# Patient Record
Sex: Male | Born: 1995
Health system: Southern US, Community
[De-identification: ages and names within clinical notes are randomized; demographics above are authoritative.]

## PROBLEM LIST (undated history)

## (undated) DIAGNOSIS — T7840XA Allergy, unspecified, initial encounter: Secondary | ICD-10-CM

## (undated) DIAGNOSIS — F419 Anxiety disorder, unspecified: Secondary | ICD-10-CM

## (undated) DIAGNOSIS — B019 Varicella without complication: Secondary | ICD-10-CM

## (undated) HISTORY — DX: Varicella without complication: B01.9

## (undated) HISTORY — DX: Allergy, unspecified, initial encounter: T78.40XA

## (undated) HISTORY — DX: Anxiety disorder, unspecified: F41.9

---
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
## 2015-11-13 DIAGNOSIS — J208 Acute bronchitis due to other specified organisms: Secondary | ICD-10-CM | POA: Diagnosis not present

## 2015-11-13 DIAGNOSIS — B9689 Other specified bacterial agents as the cause of diseases classified elsewhere: Secondary | ICD-10-CM | POA: Diagnosis not present

## 2015-12-05 DIAGNOSIS — R0982 Postnasal drip: Secondary | ICD-10-CM | POA: Diagnosis not present

## 2015-12-05 DIAGNOSIS — B9789 Other viral agents as the cause of diseases classified elsewhere: Secondary | ICD-10-CM | POA: Diagnosis not present

## 2015-12-05 DIAGNOSIS — J069 Acute upper respiratory infection, unspecified: Secondary | ICD-10-CM | POA: Diagnosis not present

## 2015-12-06 MED FILL — BENZONATATE 100 MG CAPSULE: 100 | 10 days supply | Qty: 30 | Fill #0

## 2015-12-27 DIAGNOSIS — L301 Dyshidrosis [pompholyx]: Secondary | ICD-10-CM | POA: Diagnosis not present

## 2016-01-07 DIAGNOSIS — G4700 Insomnia, unspecified: Secondary | ICD-10-CM | POA: Insufficient documentation

## 2016-01-10 DIAGNOSIS — J988 Other specified respiratory disorders: Secondary | ICD-10-CM | POA: Diagnosis not present

## 2016-01-10 DIAGNOSIS — B9789 Other viral agents as the cause of diseases classified elsewhere: Secondary | ICD-10-CM | POA: Diagnosis not present

## 2016-01-10 DIAGNOSIS — R05 Cough: Secondary | ICD-10-CM | POA: Diagnosis not present

## 2016-01-23 ENCOUNTER — Ambulatory Visit (INDEPENDENT_AMBULATORY_CARE_PROVIDER_SITE_OTHER): Payer: 59 | Admitting: Family Medicine

## 2016-01-23 VITALS — BP 120/74 | HR 76 | Wt 213.0 lb

## 2016-01-23 DIAGNOSIS — S29012A Strain of muscle and tendon of back wall of thorax, initial encounter: Secondary | ICD-10-CM | POA: Diagnosis not present

## 2016-01-23 MED ORDER — NAPROXEN 500 MG PO TABS: 500.0000 mg | ORAL_TABLET | Freq: Two times a day (BID) | ORAL | Status: DC

## 2016-01-23 MED ORDER — CYCLOBENZAPRINE HCL 10 MG PO TABS: 10.0000 mg | ORAL_TABLET | Freq: Three times a day (TID) | ORAL | Status: DC | PRN

## 2016-01-23 NOTE — Patient Instructions (Signed)
Thank you for coming in today. Attend PT.  Use a heating pad and tens unit.  Return in 2-4 weeks if not better.    TENS UNIT: This is helpful for muscle pain and spasm.    Search and Purchase a TENS 7000 2nd edition at www.tenspros.com. It should be less than $30.     TENS unit instructions: Do not shower or bathe with the unit on Turn the unit off before removing electrodes or batteries If the electrodes lose stickiness add a drop of water to the electrodes after they are disconnected from the unit and place on plastic sheet. If you continued to have difficulty, call the TENS unit company to purchase more electrodes. Do not apply lotion on the skin area prior to use. Make sure the skin is clean and dry as this will help prolong the life of the electrodes. After use, always check skin for unusual red areas, rash or other skin difficulties. If there are any skin problems, does not apply electrodes to the same area. Never remove the electrodes from the unit by pulling the wires. Do not use the TENS unit or electrodes other than as directed. Do not change electrode placement without consultating your therapist or physician. Keep 2 fingers with between each electrode. Wear time ratio is 2:1, on to off times.    For example on for 30 minutes off for 15 minutes and then on for 30 minutes off for 15 minutes

## 2016-01-23 NOTE — Progress Notes (Signed)
          Ricky Douglas is a 20 y.o. male [facility] pain. Patient notes severe mid back pain present for about 4 days. Pain occurred after he helped a friend move. He denies any radiating pain weakness or numbness. He denies any specific injury. He's tried ibuprofen which helped a bit.   No past medical history on file. No past surgical history on file. Social History  Substance Use Topics  . Smoking status: Not on file  . Smokeless tobacco: Not on file  . Alcohol Use: Not on file   family history is not on file.  ROS:  No headache, visual changes, nausea, vomiting, diarrhea, constipation, dizziness, abdominal pain, skin rash, fevers, chills, night sweats, weight loss, swollen lymph nodes, body aches, joint swelling, muscle aches, chest pain, shortness of breath, mood changes, visual or auditory hallucinations.    Medications: Current Outpatient Prescriptions  Medication Sig Dispense Refill  . cyclobenzaprine (FLEXERIL) 10 MG tablet Take 1 tablet (10 mg total) by mouth 3 (three) times daily as needed for muscle spasms. 30 tablet 0  . naproxen (NAPROSYN) 500 MG tablet Take 1 tablet (500 mg total) by mouth 2 (two) times daily with a meal. 30 tablet 0   No current facility-administered medications for this visit.   Allergies  Allergen Reactions  . Codeine Itching and Nausea And Vomiting     Exam:  BP 120/74 mmHg  Pulse 76  Wt 213 lb (96.616 kg) General: Well Developed, well nourished, and in no acute distress.  Neuro/Psych: Alert and oriented x3, extra-ocular muscles intact, able to move all 4 extremities, sensation grossly intact. Skin: Warm and dry, no rashes noted.  Respiratory: Not using accessory muscles, speaking in full sentences, trachea midline.  Cardiovascular: Pulses palpable, no extremity edema. Abdomen: Does not appear distended. MSK: Nontender to spinal midline. Tender palpation bilateral  rhomboids. Normal thoracic and lumbar back motion. Reflexes strength and sensation are intact and equal bilateral upper and lower extremities. Normal gait.   No results found for this or any previous visit (from the past 24 hour(s)). No results found.   20 year old male with rhomboid strain. Treat with physical therapy Flexeril and naproxen TENS unit and heating pad. Recheck in 2-4 weeks if not better.

## 2016-04-03 DIAGNOSIS — J309 Allergic rhinitis, unspecified: Secondary | ICD-10-CM | POA: Diagnosis not present

## 2016-04-03 DIAGNOSIS — R21 Rash and other nonspecific skin eruption: Secondary | ICD-10-CM | POA: Diagnosis not present

## 2016-04-10 DIAGNOSIS — R21 Rash and other nonspecific skin eruption: Secondary | ICD-10-CM | POA: Diagnosis not present

## 2016-05-09 DIAGNOSIS — L709 Acne, unspecified: Secondary | ICD-10-CM | POA: Insufficient documentation

## 2016-05-09 DIAGNOSIS — F902 Attention-deficit hyperactivity disorder, combined type: Secondary | ICD-10-CM | POA: Diagnosis not present

## 2016-05-09 DIAGNOSIS — F909 Attention-deficit hyperactivity disorder, unspecified type: Secondary | ICD-10-CM | POA: Insufficient documentation

## 2016-05-09 DIAGNOSIS — F341 Dysthymic disorder: Secondary | ICD-10-CM | POA: Diagnosis not present

## 2016-05-09 DIAGNOSIS — F411 Generalized anxiety disorder: Secondary | ICD-10-CM | POA: Diagnosis not present

## 2016-05-09 MED FILL — BUPROPION HCL XL 150 MG TAB: 150 | 30 days supply | Qty: 30 | Fill #0 | Status: TO

## 2016-05-17 DIAGNOSIS — F411 Generalized anxiety disorder: Secondary | ICD-10-CM | POA: Insufficient documentation

## 2016-05-17 DIAGNOSIS — F341 Dysthymic disorder: Secondary | ICD-10-CM | POA: Insufficient documentation

## 2016-10-29 MED FILL — BUPROPION HCL XL 150 MG TAB: 150 | 22 days supply | Qty: 30 | Fill #0

## 2016-11-28 MED FILL — BUPROPION HCL XL 150 MG TAB: 150 | 90 days supply | Qty: 90 | Fill #0

## 2017-02-18 DIAGNOSIS — J019 Acute sinusitis, unspecified: Secondary | ICD-10-CM | POA: Diagnosis not present

## 2017-02-18 DIAGNOSIS — J209 Acute bronchitis, unspecified: Secondary | ICD-10-CM | POA: Diagnosis not present

## 2017-05-02 DIAGNOSIS — T782XXA Anaphylactic shock, unspecified, initial encounter: Secondary | ICD-10-CM | POA: Diagnosis not present

## 2017-05-02 DIAGNOSIS — T63441A Toxic effect of venom of bees, accidental (unintentional), initial encounter: Secondary | ICD-10-CM | POA: Diagnosis not present

## 2017-05-02 DIAGNOSIS — R21 Rash and other nonspecific skin eruption: Secondary | ICD-10-CM | POA: Diagnosis not present

## 2017-05-10 DIAGNOSIS — Z91030 Bee allergy status: Secondary | ICD-10-CM | POA: Diagnosis not present

## 2017-10-08 ENCOUNTER — Ambulatory Visit: Payer: Self-pay | Admitting: Nurse Practitioner

## 2017-10-08 VITALS — BP 110/80 | HR 84 | Temp 97.9°F | Resp 16 | Wt 209.0 lb

## 2017-10-08 DIAGNOSIS — J029 Acute pharyngitis, unspecified: Secondary | ICD-10-CM

## 2017-10-08 MED ORDER — MAGIC MOUTHWASH W/LIDOCAINE: 5.0000 mL | Freq: Three times a day (TID) | ORAL | 0 refills | Status: AC | PRN

## 2017-10-08 MED ORDER — AMOXICILLIN 500 MG PO TABS: 500.0000 mg | ORAL_TABLET | Freq: Two times a day (BID) | ORAL | 0 refills | Status: AC

## 2017-10-08 NOTE — Patient Instructions (Signed)
Pharyngitis Pharyngitis is redness, pain, and swelling (inflammation) of your pharynx. What are the causes? Pharyngitis is usually caused by infection. Most of the time, these infections are from viruses (viral) and are part of a cold. However, sometimes pharyngitis is caused by bacteria (bacterial). Pharyngitis can also be caused by allergies. Viral pharyngitis may be spread from person to person by coughing, sneezing, and personal items or utensils (cups, forks, spoons, toothbrushes). Bacterial pharyngitis may be spread from person to person by more intimate contact, such as kissing. What are the signs or symptoms? Symptoms of pharyngitis include:  Sore throat.  Tiredness (fatigue).  Low-grade fever.  Headache.  Joint pain and muscle aches.  Skin rashes.  Swollen lymph nodes.  Plaque-like film on throat or tonsils (often seen with bacterial pharyngitis).  How is this diagnosed? Your health care provider will ask you questions about your illness and your symptoms. Your medical history, along with a physical exam, is often all that is needed to diagnose pharyngitis. Sometimes, a rapid strep test is done. Other lab tests may also be done, depending on the suspected cause. How is this treated? Viral pharyngitis will usually get better in 3-4 days without the use of medicine. Bacterial pharyngitis is treated with medicines that kill germs (antibiotics). Follow these instructions at home:  Drink enough water and fluids to keep your urine clear or pale yellow.  Only take over-the-counter or prescription medicines as directed by your health care provider: ? If you are prescribed antibiotics, make sure you finish them even if you start to feel better. ? Do not take aspirin.  Get lots of rest.  Gargle with 8 oz of salt water ( tsp of salt per 1 qt of water) as often as every 1-2 hours to soothe your throat.  Throat lozenges (if you are not at risk for choking) or sprays may be used to  soothe your throat.  Use a teaspoon of honey as needed for throat pain.  Start Amoxicillin on 12/22 if symptoms do not improve.  Continue to use Advil for throat pain, fever or general discomfort.  Contact a health care provider if:  You have large, tender lumps in your neck.  You have a rash.  You cough up green, yellow-brown, or bloody spit. Get help right away if:  Your neck becomes stiff.  You drool or are unable to swallow liquids.  You vomit or are unable to keep medicines or liquids down.  You have severe pain that does not go away with the use of recommended medicines.  You have trouble breathing (not caused by a stuffy nose). This information is not intended to replace advice given to you by your health care provider. Make sure you discuss any questions you have with your health care provider. Document Released: 10/08/2005 Document Revised: 03/15/2016 Document Reviewed: 06/15/2013 Elsevier Interactive Patient Education  2017 [employer]

## 2017-10-08 NOTE — Progress Notes (Signed)
Subjective:     Ricky Douglas is a [age] y.o. male who presents for evaluation of sore throat. Associated symptoms include productive cough and sore throat. Onset of symptoms was 3 days ago, and have been gradually worsening since that time. He is drinking plenty of fluids. He has not had a recent close exposure to someone with proven streptococcal pharyngitis.  Patient has been taking a decongestant and Advil, which have helped with his throat pain, rates pain 4/10 at present.  Patient also states he has a history of mono.  Patient is allergic to codeine, but denies any past medical history.  The following portions of the patient's history were reviewed and updated as appropriate: allergies, current medications and past medical history.  Review of Systems Constitutional: negative Eyes: negative Ears, nose, mouth, throat, and face: positive for sore throat, negative for earaches and nasal congestion Respiratory: cough producing yellow sputum Cardiovascular: negative Gastrointestinal: negative Neurological: negative Allergic/Immunologic: negative    Objective:    BP 110/80 (BP Location: Right Arm, Patient Position: Sitting, Cuff Size: Normal)   Pulse 84   Temp 97.9 F (36.6 C) (Oral)   Resp 16   Wt 209 lb (94.8 kg)   SpO2 94%  General appearance: alert, cooperative and no distress Head: Normocephalic, without obvious abnormality, atraumatic Eyes: conjunctivae/corneas clear. PERRL, EOM's intact. Fundi benign. Ears: normal TM's and external ear canals both ears Nose: Nares normal. Septum midline. Mucosa normal. No drainage or sinus tenderness. Throat: abnormal findings: moderate oropharyngeal erythema Lungs: clear to auscultation bilaterally Heart: regular rate and rhythm, S1, S2 normal, no murmur, click, rub or gallop Abdomen: soft, non-tender; bowel sounds normal; no masses,  no organomegaly Skin: Skin color, texture, turgor normal. No rashes or lesions Lymph nodes: Cervical  adenopathy: left neck Neurologic: Grossly normal  Laboratory Strep test not done. Results:not done.    Assessment:    Acute pharyngitis, likely  Viral pharyngitis.    Plan:    Use of OTC analgesics recommended as well as salt water gargles. Follow up as needed. Patient to use honey for throat pain or saltwater gargles.  Will use Magic Mouthwash for severe throat pain.  Patient will start Amoxicillin on 12/22 if symptoms do not improve.  Patient also informed to continue using Advil for pain, fever or general discomfort.  Patient verbalizes understanding.

## 2017-10-09 DIAGNOSIS — R21 Rash and other nonspecific skin eruption: Secondary | ICD-10-CM | POA: Diagnosis not present

## 2017-10-09 DIAGNOSIS — T63461D Toxic effect of venom of wasps, accidental (unintentional), subsequent encounter: Secondary | ICD-10-CM | POA: Diagnosis not present

## 2017-10-09 DIAGNOSIS — Z91030 Bee allergy status: Secondary | ICD-10-CM | POA: Diagnosis not present

## 2017-10-09 DIAGNOSIS — J309 Allergic rhinitis, unspecified: Secondary | ICD-10-CM | POA: Diagnosis not present

## 2017-10-21 DIAGNOSIS — T63423D Toxic effect of venom of ants, assault, subsequent encounter: Secondary | ICD-10-CM | POA: Diagnosis not present

## 2017-10-29 ENCOUNTER — Other Ambulatory Visit (HOSPITAL_COMMUNITY)
Admission: RE | Admit: 2017-10-29 | Discharge: 2017-10-29 | Disposition: A | Discharge status: Home / Self Care | Payer: 59 | Source: Ambulatory Visit | Attending: Nurse Practitioner | Admitting: Nurse Practitioner

## 2017-10-29 ENCOUNTER — Encounter: Payer: Self-pay | Admitting: Nurse Practitioner

## 2017-10-29 ENCOUNTER — Ambulatory Visit (INDEPENDENT_AMBULATORY_CARE_PROVIDER_SITE_OTHER): Payer: 59 | Admitting: Nurse Practitioner

## 2017-10-29 VITALS — BP 106/70 | HR 78 | Temp 97.4°F | Ht 73.0 in | Wt 194.0 lb

## 2017-10-29 DIAGNOSIS — M545 Low back pain, unspecified: Secondary | ICD-10-CM

## 2017-10-29 DIAGNOSIS — Z113 Encounter for screening for infections with a predominantly sexual mode of transmission: Secondary | ICD-10-CM

## 2017-10-29 DIAGNOSIS — J309 Allergic rhinitis, unspecified: Secondary | ICD-10-CM

## 2017-10-29 DIAGNOSIS — Z0001 Encounter for general adult medical examination with abnormal findings: Secondary | ICD-10-CM | POA: Insufficient documentation

## 2017-10-29 MED ORDER — NAPROXEN 500 MG PO TABS: 500.0000 mg | ORAL_TABLET | Freq: Two times a day (BID) | ORAL | 0 refills | Status: DC | PRN

## 2017-10-29 MED ORDER — CETIRIZINE HCL 10 MG PO TABS: 10.0000 mg | ORAL_TABLET | Freq: Every day | ORAL | 0 refills | Status: DC

## 2017-10-29 MED ORDER — CYCLOBENZAPRINE HCL 5 MG PO TABS: 5.0000 mg | ORAL_TABLET | Freq: Two times a day (BID) | ORAL | 0 refills | Status: DC | PRN

## 2017-10-29 MED FILL — NAPROXEN 500 MG TABLET: 500 | 15 days supply | Qty: 30 | Fill #0

## 2017-10-29 MED FILL — CYCLOBENZAPRINE 5 MG TABLET: 5 | 10 days supply | Qty: 20 | Fill #0

## 2017-10-29 NOTE — Patient Instructions (Signed)
Go to lab for blood draw and urine collection. You will be called with results.  Decrease alcohol intake to 1-3beers per week. Avoid marijuana use.  Let me know if sore throat does not improve Let me know when you what referral to ENT.  Insomnia Insomnia is a sleep disorder that makes it difficult to fall asleep or to stay asleep. Insomnia can cause tiredness (fatigue), low energy, difficulty concentrating, mood swings, and poor performance at work or school. There are three different ways to classify insomnia:  Difficulty falling asleep.  Difficulty staying asleep.  Waking up too early in the morning.  Any type of insomnia can be long-term (chronic) or short-term (acute). Both are common. Short-term insomnia usually lasts for three months or less. Chronic insomnia occurs at least three times a week for longer than three months. What are the causes? Insomnia may be caused by another condition, situation, or substance, such as:  Anxiety.  Certain medicines.  Gastroesophageal reflux disease (GERD) or other gastrointestinal conditions.  Asthma or other breathing conditions.  Restless legs syndrome, sleep apnea, or other sleep disorders.  Chronic pain.  Menopause. This may include hot flashes.  Stroke.  Abuse of alcohol, tobacco, or illegal drugs.  Depression.  Caffeine.  Neurological disorders, such as Alzheimer disease.  An overactive thyroid (hyperthyroidism).  The cause of insomnia may not be known. What increases the risk? Risk factors for insomnia include:  Gender. Women are more commonly affected than men.  Age. Insomnia is more common as you get older.  Stress. This may involve your professional or personal life.  Income. Insomnia is more common in people with lower income.  Lack of exercise.  Irregular work schedule or night shifts.  Traveling between different time zones.  What are the signs or symptoms? If you have insomnia, trouble falling  asleep or trouble staying asleep is the main symptom. This may lead to other symptoms, such as:  Feeling fatigued.  Feeling nervous about going to sleep.  Not feeling rested in the morning.  Having trouble concentrating.  Feeling irritable, anxious, or depressed.  How is this treated? Treatment for insomnia depends on the cause. If your insomnia is caused by an underlying condition, treatment will focus on addressing the condition. Treatment may also include:  Medicines to help you sleep.  Counseling or therapy.  Lifestyle adjustments.  Follow these instructions at home:  Take medicines only as directed by your health care provider.  Keep regular sleeping and waking hours. Avoid naps.  Keep a sleep diary to help you and your health care provider figure out what could be causing your insomnia. Include: ? When you sleep. ? When you wake up during the night. ? How well you sleep. ? How rested you feel the next day. ? Any side effects of medicines you are taking. ? What you eat and drink.  Make your bedroom a comfortable place where it is easy to fall asleep: ? Put up shades or special blackout curtains to block light from outside. ? Use a white noise machine to block noise. ? Keep the temperature cool.  Exercise regularly as directed by your health care provider. Avoid exercising right before bedtime.  Use relaxation techniques to manage stress. Ask your health care provider to suggest some techniques that may work well for you. These may include: ? Breathing exercises. ? Routines to release muscle tension. ? Visualizing peaceful scenes.  Cut back on alcohol, caffeinated beverages, and cigarettes, especially close to bedtime. These can  disrupt your sleep.  Do not overeat or eat spicy foods right before bedtime. This can lead to digestive discomfort that can make it hard for you to sleep.  Limit screen use before bedtime. This includes: ? Watching TV. ? Using your  smartphone, tablet, and computer.  Stick to a routine. This can help you fall asleep faster. Try to do a quiet activity, brush your teeth, and go to bed at the same time each night.  Get out of bed if you are still awake after 15 minutes of trying to sleep. Keep the lights down, but try reading or doing a quiet activity. When you feel sleepy, go back to bed.  Make sure that you drive carefully. Avoid driving if you feel very sleepy.  Keep all follow-up appointments as directed by your health care provider. This is important. Contact a health care provider if:  You are tired throughout the day or have trouble in your daily routine due to sleepiness.  You continue to have sleep problems or your sleep problems get worse. Get help right away if:  You have serious thoughts about hurting yourself or someone else. This information is not intended to replace advice given to you by your health care provider. Make sure you discuss any questions you have with your health care provider. Document Released: 10/05/2000 Document Revised: 03/09/2016 Document Reviewed: 07/09/2014 Elsevier Interactive Patient Education  2018 [place] for [place], Male The transition to life after high school as a young adult can be a stressful time with many changes. You may start seeing a primary care physician instead of a pediatrician. This is the time when your health care becomes your responsibility. Preventive care refers to lifestyle choices and visits with your health care provider that can promote health and wellness. What does preventive care include?  A yearly physical exam. This is also called an annual wellness visit.  Dental exams once or twice a year.  Routine eye exams. Ask your health care provider how often you should have your eyes checked.  Personal lifestyle choices, including: ? Daily care of your teeth and gums. ? Regular physical activity. ? Eating a healthy  diet. ? Avoiding tobacco and drug use. ? Avoiding or limiting alcohol use. ? Practicing safe sex. What happens during an annual wellness visit? Preventive care starts with a yearly visit to your primary care physician. The services and screenings done by your health care provider during your annual wellness visit will depend on your overall health, lifestyle risk factors, and family history of disease. Counseling Your health care provider may ask you questions about:  Past medical problems and your family's medical history.  Medicines or supplements that you take.  Health insurance and access to health care.  Alcohol, tobacco, and drug use, including use of any bodybuilding drugs (anabolic steroids).  Your safety at home, work, or school.  Access to firearms.  Emotional well-being and how you cope with stress.  Relationship well-being.  Diet, exercise, and sleep habits.  Your sexual health and activity.  Screening You may have the following tests or measurements:  Height, weight, and BMI.  Blood pressure.  Lipid and cholesterol levels.  Tuberculosis skin test.  Skin exam.  Vision and hearing tests.  Genital exam to check for testicular cancer or hernias.  Screening test for hepatitis.  Screening tests for STDs (sexually transmitted diseases), if you are at risk.  Vaccines Your health care provider may recommend certain vaccines, such as:  Influenza vaccine. This is recommended every year.  Tetanus, diphtheria, and acellular pertussis (Tdap, Td) vaccine. You may need a Td booster every 10 years.  Varicella vaccine. You may need this if you have not been vaccinated.  HPV vaccine. If you are 32 or younger, you may need three doses over 6 months.  Measles, mumps, and rubella (MMR) vaccine. You may need at least one dose of MMR. You may also need a second dose.  Pneumococcal 13-valent conjugate (PCV13) vaccine. You may need this if you have certain conditions  and have not been vaccinated.  Pneumococcal polysaccharide (PPSV23) vaccine. You may need one or two doses if you smoke cigarettes or if you have certain conditions.  Meningococcal vaccine. One dose is recommended if you are age 25-21 years and a first-year college student living in a residence hall, or if you have one of several medical conditions. You may also need additional booster doses.  Hepatitis A vaccine. You may need this if you have certain conditions or if you travel or work in places where you may be exposed to hepatitis A.  Hepatitis B vaccine. You may need this if you have certain conditions or if you travel or work in places where you may be exposed to hepatitis B.  Haemophilus influenzae type b (Hib) vaccine. You may need this if you have certain risk factors.  Talk to your health care provider about which screenings and vaccines you need and how often you need them. What steps can I take to develop healthy behaviors?  Have regular preventive health care visits with your primary care physician and dentist.  Eat a healthy diet.  Drink enough fluid to keep your urine clear or pale yellow.  Stay active. Exercise at least 30 minutes 5 or more days of the week.  Use alcohol responsibly.  Maintain a healthy weight.  Do not use any products that contain nicotine, such as cigarettes, chewing tobacco, and e-cigarettes. If you need help quitting, ask your health care provider.  Do not use drugs.  Practice safe sex. This includes using condoms to prevent STDs or an unwanted pregnancy.  Find healthy ways to manage stress. How can I protect myself from injury? Injuries from violence or accidents are the leading cause of death among young adults and can often be prevented. Take these steps to help protect yourself:  Always wear your seat belt while driving or riding in a vehicle.  Do not drive if you have been drinking alcohol. Do not ride with someone who has been  drinking.  Do not drive when you are tired or distracted. Do not text while driving.  Wear a helmet and other protective equipment during sports activities.  If you have firearms in your house, make sure you follow all gun safety procedures.  Seek help if you have been bullied, physically abused, or sexually abused.  Avoid fighting.  Use the Internet responsibly to avoid dangers such as online bullying.  What can I do to cope with stress? Young adults may face many new challenges that can be stressful, such as finding a job, going to college, moving away from home, managing money, being in a relationship, getting married, and having children. To manage stress:  Avoid known stressful situations when you can.  Exercise regularly.  Find a stress-reducing activity that works best for you. Examples include meditation, yoga, listening to music, or reading.  Spend time in nature.  Keep a journal to write about your stress and how  you respond.  Talk to your health care provider about stress. He or she may suggest counseling.  Spend time with supportive friends or family.  Do not cope with stress by: ? Drinking alcohol or using drugs. ? Smoking cigarettes. ? Eating.  Where can I get more information? Learn more about preventive care and healthy habits from:  U.S. Preventive Services Task Force: StageSync.si  National Adolescent and [place]: StrategicRoad.nl  American Academy of Pediatrics Bright Futures: https://brightfutures.MemberVerification.co.za  Society for Adolescent Health and Medicine: MoralBlog.co.za.aspx  PodExchange.nl: ToyLending.fr  This information is not intended to replace advice given to you by your health care provider. Make sure  you discuss any questions you have with your health care provider. Document Released: 02/23/2016 Document Revised: 03/15/2016 Document Reviewed: 02/23/2016 Elsevier Interactive Patient Education  [employer].

## 2017-10-29 NOTE — Progress Notes (Signed)
Subjective:    Patient ID: Ricky Douglas, male    DOB: August 18, 1996, 22 y.o.   MRN: [id number]  Patient presents today for complete physical  HPI  Student at [employer], [place]. Part time job at [employer]  Diagnosed with ADD and anxiety at child. Has not use wellbutrin nor a stimulant since 2017.  Back pain: Intermittent, Worse with extension Onset 1week ago  Trouble falling asleep and staying asleep: Sleep study done 2014 (normal per patient). Mild improvement with melatonin and benadryl. Interrupted sleep, sleeps for 4-5hrs at a time, no restorative sleep. Has gain 10Ilbs since 2014 Caffeine: 1cup of coffee per day. ETOH: beer (18 beers per week).  Immunizations: (TDAP, Hep C screen, Pneumovax, Influenza, zoster)  Health Maintenance  Topic Date Due  . Tetanus Vaccine  05/18/2015  . Flu Shot  07/01/2018*  . HIV Screening  Completed  *Topic was postponed. The date shown is not the original due date.   Diet:regular.  Weight:  Wt Readings from Last 3 Encounters:  10/29/17 194 lb (88 kg)  10/08/17 209 lb (94.8 kg)  01/23/16 213 lb (96.6 kg) (96 %, Z= 1.72)*   * Growth percentiles are based on CDC (Boys, 2-20 Years) data.   Exercise:soccer and running 2-3times a week.  Fall Risk: Fall Risk  10/29/2017  Falls in the past year? No   Home Safety:student in college and occasionally at home with family.  Depression/Suicide: Depression screen [facility] 2/9 10/29/2017  Decreased Interest 0  Down, Depressed, Hopeless 0  PHQ - 2 Score 0   Vision:up to date.  Dental:up to date.  Sexual History (birth control, marital status, STD):sexually active with condom use, more than one male partner.  Medications and allergies reviewed with patient and updated if appropriate.  Patient Active Problem List   Diagnosis Date Noted  . Cannot sleep 01/07/2016    No current outpatient medications on file prior to visit.   No current facility-administered medications  on file prior to visit.     Past Medical History:  Diagnosis Date  . Allergy   . Chicken pox     Past Surgical History:  Procedure Laterality Date  . TONSILLECTOMY AND ADENOIDECTOMY  2000    Social History   Socioeconomic History  . Marital status: Single    Spouse name: None  . Number of children: None  . Years of education: None  . Highest education level: None  Social Needs  . Financial resource strain: None  . Food insecurity - worry: None  . Food insecurity - inability: None  . Transportation needs - medical: None  . Transportation needs - non-medical: None  Occupational History  . None  Tobacco Use  . Smoking status: Current Some Day Smoker    Types: Cigarettes  . Smokeless tobacco: Current User    Types: Chew  Substance and Sexual Activity  . Alcohol use: Yes    Comment: social  . Drug use: Yes    Frequency: 3.0 times per week    Types: Marijuana  . Sexual activity: None  Other Topics Concern  . None  Social History Narrative  . None    Family History  Problem Relation Age of Onset  . Arthritis Mother   . Heart attack Mother   . Miscarriages / [place] Mother   . Arthritis Father   . Hearing loss Father   . Arthritis Maternal Grandmother   . Cancer Maternal Grandmother   . Depression Maternal Grandmother   .  Hypertension Maternal Grandmother   . Depression Maternal Grandfather   . Hearing loss Maternal Grandfather   . Arthritis Paternal Grandmother   . Depression Paternal Grandfather   . Hearing loss Paternal Grandfather        Review of Systems  Constitutional: Negative for fever, malaise/fatigue and weight loss.  HENT: Positive for congestion. Negative for sinus pain and sore throat.   Eyes:       Negative for visual changes  Respiratory: Negative for cough and shortness of breath.   Cardiovascular: Negative for chest pain, palpitations and leg swelling.  Gastrointestinal: Negative for blood in stool, constipation, diarrhea and  heartburn.  Genitourinary: Negative for dysuria, frequency and urgency.  Musculoskeletal: Positive for back pain. Negative for falls, joint pain and myalgias.  Skin: Negative for rash.  Neurological: Negative for dizziness, sensory change and headaches.  Endo/Heme/Allergies: Does not bruise/bleed easily.  Psychiatric/Behavioral: Negative for depression, substance abuse and suicidal ideas. The patient is not nervous/anxious.     Objective:   Vitals:   10/29/17 1334  BP: 106/70  Pulse: 78  Temp: (!) 97.4 F (36.3 C)  SpO2: 98%    Body mass index is 25.6 kg/m.   Physical Examination:  Physical Exam  Constitutional: He is oriented to person, place, and time and well-developed, well-nourished, and in no distress. No distress.  HENT:  Right Ear: External ear and ear canal normal. A middle ear effusion is present.  Left Ear: External ear and ear canal normal. A middle ear effusion is present.  Nose: Mucosal edema and rhinorrhea present. Right sinus exhibits no maxillary sinus tenderness and no frontal sinus tenderness. Left sinus exhibits no maxillary sinus tenderness and no frontal sinus tenderness.  Mouth/Throat: Oropharynx is clear and moist. No oropharyngeal exudate.  Eyes: Conjunctivae and EOM are normal. Pupils are equal, round, and reactive to light. No scleral icterus.  Neck: Normal range of motion. Neck supple. No thyromegaly present.  Cardiovascular: Normal rate, normal heart sounds and intact distal pulses.  Pulmonary/Chest: Effort normal and breath sounds normal. He exhibits no tenderness.  Abdominal: Soft. Bowel sounds are normal. He exhibits no distension. There is no tenderness.  Musculoskeletal: Normal range of motion. He exhibits no edema or tenderness.  Lymphadenopathy:    He has no cervical adenopathy.  Neurological: He is alert and oriented to person, place, and time. Gait normal.  Skin: Skin is warm and dry.  Psychiatric: Affect and judgment normal.  Vitals  reviewed.   ASSESSMENT and PLAN:  Ricky Douglas was seen today for establish care.  Diagnoses and all orders for this visit:  Encounter for preventative adult health care exam with abnormal findings -     CBC with Differential/Platelet -     Comprehensive metabolic panel -     TSH -     Urine cytology ancillary only -     HIV antibody  Acute midline low back pain without sciatica -     cyclobenzaprine (FLEXERIL) 5 MG tablet; Take 1 tablet (5 mg total) by mouth every 12 (twelve) hours as needed for muscle spasms. -     naproxen (NAPROSYN) 500 MG tablet; Take 1 tablet (500 mg total) by mouth every 12 (twelve) hours as needed (with food).  Allergic rhinitis, unspecified seasonality, unspecified trigger -     cetirizine (ZYRTEC) 10 MG tablet; Take 1 tablet (10 mg total) by mouth daily.  Screen for STD (sexually transmitted disease) -     Urine cytology ancillary only -  HIV antibody   No problem-specific Assessment & Plan notes found for this encounter.     Follow up: Return in about 1 year (around 10/29/2018) for CPE (fasting).  Alysia Penna, NP

## 2017-10-29 NOTE — Progress Notes (Deleted)
   Subjective:  Patient ID: Ricky Douglas, male    DOB: 1996-01-24  Age: 22 y.o. MRN: [id number]  CC: Establish Care (est care--CPE--not fasting)   Ricky Douglas presents for ***  HPI  Outpatient Medications Prior to Visit  Medication Sig Dispense Refill  . cyclobenzaprine (FLEXERIL) 10 MG tablet Take 1 tablet (10 mg total) by mouth 3 (three) times daily as needed for muscle spasms. (Patient not taking: Reported on 10/08/2017) 30 tablet 0  . naproxen (NAPROSYN) 500 MG tablet Take 1 tablet (500 mg total) by mouth 2 (two) times daily with a meal. (Patient not taking: Reported on 10/08/2017) 30 tablet 0   No facility-administered medications prior to visit.     ROS See HPI  Objective:  BP 106/70 (BP Location: Left Arm, Patient Position: Sitting, Cuff Size: Large)   Pulse 78   Temp (!) 97.4 F (36.3 C) (Oral)   Ht 6\' 1"  (1.854 m)   Wt 194 lb (88 kg)   SpO2 98%   BMI 25.60 kg/m   BP Readings from Last 3 Encounters:  10/29/17 106/70  10/08/17 110/80  01/23/16 120/74    Wt Readings from Last 3 Encounters:  10/29/17 194 lb (88 kg)  10/08/17 209 lb (94.8 kg)  01/23/16 213 lb (96.6 kg) (96 %, Z= 1.72)*   * Growth percentiles are based on CDC (Boys, 2-20 Years) data.    Physical Exam  No results found for: WBC, HGB, HCT, PLT, GLUCOSE, CHOL, TRIG, HDL, LDLDIRECT, LDLCALC, ALT, AST, NA, K, CL, CREATININE, BUN, CO2, TSH, PSA, INR, GLUF, HGBA1C, MICROALBUR  Dg Nasal Bones  Result Date: 03/10/2014 CLINICAL DATA:  Facial injury. Laceration and bleeding with swelling in the nasal area. EXAM: NASAL BONES - 3+ VIEW COMPARISON:  None. FINDINGS: There is a nondisplaced nasal bone fracture. There is overlying soft tissue swelling. IMPRESSION: Nondisplaced nasal bone fracture. Electronically Signed   By: Sebastian AcheAllen  Grady   On: 03/10/2014 14:41    Assessment & Plan:   There are no diagnoses linked to this encounter. I am having Ricky LavBradley I. Low maintain his naproxen and  cyclobenzaprine.  No orders of the defined types were placed in this encounter.   ***  Follow-up: No Follow-up on file.  Alysia Pennaharlotte Nche, NP

## 2017-10-30 LAB — URINE CYTOLOGY ANCILLARY ONLY
Chlamydia: NEGATIVE
Neisseria Gonorrhea: NEGATIVE
Trichomonas: NEGATIVE

## 2017-10-30 LAB — HIV ANTIBODY (ROUTINE TESTING W REFLEX): HIV 1&2 Ab, 4th Generation: NONREACTIVE

## 2017-11-01 ENCOUNTER — Encounter: Payer: Self-pay | Admitting: Nurse Practitioner

## 2017-11-06 NOTE — Telephone Encounter (Signed)
[facility] with [facility] to make aware of lab error. [facility] took not of the pt's name and MRN to ensure that they would not be charged for the labs. (at 12:02p)   Sent MyChart message of lab error, office number included in message. Also called the pt on their cell phone. Told pt of the lab error and that we would need to schedule him for new lab draw. Pt stated that they were currently back in school and would need to schedule for labs in February. Pt was given direct phone number to the lab if they could schedule the labs any sooner.

## 2017-11-06 NOTE — Addendum Note (Signed)
Addended by: Arva ChafeGARCIA, Arabelle Bollig M on: 11/06/2017 12:59 PM   Modules accepted: Orders

## 2019-02-09 ENCOUNTER — Telehealth: Payer: Self-pay | Admitting: Nurse Practitioner

## 2019-02-09 NOTE — Telephone Encounter (Signed)
Called on behalf of Alysia Penna since it has been a while since last visit, left message

## 2019-05-05 ENCOUNTER — Other Ambulatory Visit (HOSPITAL_COMMUNITY)
Admission: RE | Admit: 2019-05-05 | Discharge: 2019-05-05 | Disposition: A | Discharge status: Home / Self Care | Payer: 59 | Source: Ambulatory Visit | Attending: Nurse Practitioner | Admitting: Nurse Practitioner

## 2019-05-05 ENCOUNTER — Encounter: Payer: Self-pay | Admitting: Nurse Practitioner

## 2019-05-05 ENCOUNTER — Other Ambulatory Visit: Payer: Self-pay

## 2019-05-05 ENCOUNTER — Ambulatory Visit (INDEPENDENT_AMBULATORY_CARE_PROVIDER_SITE_OTHER): Payer: 59 | Admitting: Nurse Practitioner

## 2019-05-05 VITALS — BP 94/66 | HR 75 | Temp 98.5°F | Ht 73.0 in | Wt 217.0 lb

## 2019-05-05 DIAGNOSIS — Z113 Encounter for screening for infections with a predominantly sexual mode of transmission: Secondary | ICD-10-CM

## 2019-05-05 DIAGNOSIS — Z0000 Encounter for general adult medical examination without abnormal findings: Secondary | ICD-10-CM | POA: Insufficient documentation

## 2019-05-05 NOTE — Patient Instructions (Signed)
Go to lab for urine collection  Return to lab fasting for blood draw.   Health Maintenance, Male Adopting a healthy lifestyle and getting preventive care are important in promoting health and wellness. Ask your health care provider about:  The right schedule for you to have regular tests and exams.  Things you can do on your own to prevent diseases and keep yourself healthy. What should I know about diet, weight, and exercise? Eat a healthy diet   Eat a diet that includes plenty of vegetables, fruits, low-fat dairy products, and lean protein.  Do not eat a lot of foods that are high in solid fats, added sugars, or sodium. Maintain a healthy weight Body mass index (BMI) is a measurement that can be used to identify possible weight problems. It estimates body fat based on height and weight. Your health care provider can help determine your BMI and help you achieve or maintain a healthy weight. Get regular exercise Get regular exercise. This is one of the most important things you can do for your health. Most adults should:  Exercise for at least 150 minutes each week. The exercise should increase your heart rate and make you sweat (moderate-intensity exercise).  Do strengthening exercises at least twice a week. This is in addition to the moderate-intensity exercise.  Spend less time sitting. Even light physical activity can be beneficial. Watch cholesterol and blood lipids Have your blood tested for lipids and cholesterol at 23 years of age, then have this test every 5 years. You may need to have your cholesterol levels checked more often if:  Your lipid or cholesterol levels are high.  You are older than 23 years of age.  You are at high risk for heart disease. What should I know about cancer screening? Many types of cancers can be detected early and may often be prevented. Depending on your health history and family history, you may need to have cancer screening at various ages.  This may include screening for:  Colorectal cancer.  Prostate cancer.  Skin cancer.  Lung cancer. What should I know about heart disease, diabetes, and high blood pressure? Blood pressure and heart disease  High blood pressure causes heart disease and increases the risk of stroke. This is more likely to develop in people who have high blood pressure readings, are of African descent, or are overweight.  Talk with your health care provider about your target blood pressure readings.  Have your blood pressure checked: ? Every 3-5 years if you are 38-81 years of age. ? Every year if you are 41 years old or older.  If you are between the ages of 76 and 51 and are a current or former smoker, ask your health care provider if you should have a one-time screening for abdominal aortic aneurysm (AAA). Diabetes Have regular diabetes screenings. This checks your fasting blood sugar level. Have the screening done:  Once every three years after age 96 if you are at a normal weight and have a low risk for diabetes.  More often and at a younger age if you are overweight or have a high risk for diabetes. What should I know about preventing infection? Hepatitis B If you have a higher risk for hepatitis B, you should be screened for this virus. Talk with your health care provider to find out if you are at risk for hepatitis B infection. Hepatitis C Blood testing is recommended for:  Everyone born from [birth year] through 1965.  Anyone with known  risk factors for hepatitis C. Sexually transmitted infections (STIs)  You should be screened each year for STIs, including gonorrhea and chlamydia, if: ? You are sexually active and are younger than 23 years of age. ? You are older than 23 years of age and your health care provider tells you that you are at risk for this type of infection. ? Your sexual activity has changed since you were last screened, and you are at increased risk for chlamydia or gonorrhea.  Ask your health care provider if you are at risk.  Ask your health care provider about whether you are at high risk for HIV. Your health care provider may recommend a prescription medicine to help prevent HIV infection. If you choose to take medicine to prevent HIV, you should first get tested for HIV. You should then be tested every 3 months for as long as you are taking the medicine. Follow these instructions at home: Lifestyle  Do not use any products that contain nicotine or tobacco, such as cigarettes, e-cigarettes, and chewing tobacco. If you need help quitting, ask your health care provider.  Do not use street drugs.  Do not share needles.  Ask your health care provider for help if you need support or information about quitting drugs. Alcohol use  Do not drink alcohol if your health care provider tells you not to drink.  If you drink alcohol: ? Limit how much you have to 0-2 drinks a day. ? Be aware of how much alcohol is in your drink. In the [place] one drink equals one 12 oz bottle of beer (355 mL), one 5 oz glass of wine (148 mL), or one 1 oz glass of hard liquor (44 mL). General instructions  Schedule regular health, dental, and eye exams.  Stay current with your vaccines.  Tell your health care provider if: ? You often feel depressed. ? You have ever been abused or do not feel safe at home. Summary  Adopting a healthy lifestyle and getting preventive care are important in promoting health and wellness.  Follow your health care provider's instructions about healthy diet, exercising, and getting tested or screened for diseases.  Follow your health care provider's instructions on monitoring your cholesterol and blood pressure. This information is not intended to replace advice given to you by your health care provider. Make sure you discuss any questions you have with your health care provider. Document Released: 04/05/2008 Document Revised: 10/01/2018 Document Reviewed:  10/01/2018 Elsevier Patient Education  2020 [employer].

## 2019-05-05 NOTE — Progress Notes (Signed)
Subjective:    Patient ID: Ricky Douglas, male    DOB: 1996-04-28, 23 y.o.   MRN: [id number]  Patient presents today for complete physical   HPI Denies any acute complaint.  Sexual History (orientation,birth control, marital status, STD):sexually active, wants STD testing  Hx of anxiety, Depression and ADHD: No medication use at this time. States mood has been stable and no difficulty with task at home and at work.  Depression/Suicide: Depression screen [facility] 2/9 05/05/2019 10/29/2017  Decreased Interest 0 0  Down, Depressed, Hopeless 0 0  PHQ - 2 Score 0 0   Vision:up to date  Dental:up to date  Immunizations: (TDAP, Hep C screen, Pneumovax, Influenza, zoster): TDAP last administered 2015 per records from [facility]  Topic Date Due  . Tetanus Vaccine  05/18/2015  . Flu Shot  05/23/2019  . HIV Screening  Completed   Diet:regular.  Weight:  Wt Readings from Last 3 Encounters:  05/05/19 217 lb (98.4 kg)  10/29/17 194 lb (88 kg)  10/08/17 209 lb (94.8 kg)    Exercise:walking and running 3x/week  Fall Risk: Fall Risk  05/05/2019 10/29/2017  Falls in the past year? 0 No   Medications and allergies reviewed with patient and updated if appropriate.  Patient Active Problem List   Diagnosis Date Noted  . Generalized anxiety disorder 05/17/2016  . Dysthymia 05/17/2016  . ADHD (attention deficit hyperactivity disorder) 05/09/2016  . Acne 05/09/2016  . Cannot sleep 01/07/2016    No current outpatient medications on file prior to visit.   No current facility-administered medications on file prior to visit.     Past Medical History:  Diagnosis Date  . Allergy   . Anxiety   . Chicken pox     Past Surgical History:  Procedure Laterality Date  . TONSILLECTOMY AND ADENOIDECTOMY  2000    Social History   Socioeconomic History  . Marital status: Single    Spouse name: Not on file  . Number of children: Not on file  . Years of  education: Not on file  . Highest education level: Not on file  Occupational History    Comment: [employer] in [place]  . Financial resource strain: Not on file  . Food insecurity    Worry: Not on file    Inability: Not on file  . Transportation needs    Medical: Not on file    Non-medical: Not on file  Tobacco Use  . Smoking status: Former Smoker    Types: Cigarettes  . Smokeless tobacco: Former Systems developer    Types: Chew  Substance and Sexual Activity  . Alcohol use: Yes    Alcohol/week: 4.0 standard drinks    Types: 3 Glasses of wine, 1 Cans of beer per week    Comment: social  . Drug use: Not Currently    Frequency: 3.0 times per week    Types: Marijuana  . Sexual activity: Yes    Birth control/protection: None  Lifestyle  . Physical activity    Days per week: Not on file    Minutes per session: Not on file  . Stress: Not on file  Relationships  . Social Herbalist on phone: Not on file    Gets together: Not on file    Attends religious service: Not on file    Active member of club or organization: Not on file    Attends meetings of clubs or organizations: Not on file  Relationship status: Not on file  Other Topics Concern  . Not on file  Social History Narrative  . Not on file    Family History  Problem Relation Age of Onset  . Arthritis Mother   . Heart attack Mother   . Miscarriages / [place] Mother   . Arthritis Father   . Hearing loss Father   . Arthritis Maternal Grandmother   . Cancer Maternal Grandmother   . Depression Maternal Grandmother   . Hypertension Maternal Grandmother   . Depression Maternal Grandfather   . Hearing loss Maternal Grandfather   . Arthritis Paternal Grandmother   . Depression Paternal Grandfather   . Hearing loss Paternal Grandfather         Review of Systems  Constitutional: Negative for fever, malaise/fatigue and weight loss.  HENT: Negative for congestion and sore throat.   Eyes:        Negative for visual changes  Respiratory: Negative for cough and shortness of breath.   Cardiovascular: Negative for chest pain, palpitations and leg swelling.  Gastrointestinal: Negative for blood in stool, constipation, diarrhea and heartburn.  Genitourinary: Negative for dysuria, frequency and urgency.  Musculoskeletal: Negative for falls, joint pain and myalgias.  Skin: Negative for rash.  Neurological: Negative for dizziness, sensory change and headaches.  Endo/Heme/Allergies: Does not bruise/bleed easily.  Psychiatric/Behavioral: Negative for depression, substance abuse and suicidal ideas. The patient is not nervous/anxious.     Objective:   Vitals:   05/05/19 1400  BP: 94/66  Pulse: 75  Temp: 98.5 F (36.9 C)  SpO2: 97%    Body mass index is 28.63 kg/m.   Physical Examination:  Physical Exam Vitals signs reviewed.  Constitutional:      General: He is not in acute distress.    Appearance: He is well-developed. He is obese.  HENT:     Head: Normocephalic.     Right Ear: Tympanic membrane, ear canal and external ear normal.     Left Ear: Tympanic membrane, ear canal and external ear normal.     Nose: Nose normal.     Mouth/Throat:     Mouth: Mucous membranes are moist.     Pharynx: No oropharyngeal exudate or posterior oropharyngeal erythema.  Eyes:     Extraocular Movements: Extraocular movements intact.     Conjunctiva/sclera: Conjunctivae normal.     Pupils: Pupils are equal, round, and reactive to light.  Neck:     Musculoskeletal: Normal range of motion and neck supple.  Cardiovascular:     Rate and Rhythm: Normal rate and regular rhythm.     Heart sounds: Normal heart sounds.  Pulmonary:     Effort: Pulmonary effort is normal. No respiratory distress.     Breath sounds: Normal breath sounds.  Chest:     Chest wall: No tenderness.  Abdominal:     General: Bowel sounds are normal.     Palpations: Abdomen is soft.     Tenderness: There is no  abdominal tenderness.  Musculoskeletal: Normal range of motion.  Lymphadenopathy:     Cervical: No cervical adenopathy.  Neurological:     Mental Status: He is alert and oriented to person, place, and time.     Deep Tendon Reflexes: Reflexes are normal and symmetric.  Psychiatric:        Mood and Affect: Mood normal.        Behavior: Behavior normal.        Thought Content: Thought content normal.     ASSESSMENT  and PLAN:  Elige RadonBradley was seen today for annual exam.  Diagnoses and all orders for this visit:  Encounter for preventative adult health care examination -     CBC; Future -     Comprehensive metabolic panel; Future -     TSH; Future -     Lipid panel; Future -     HIV antibody (with reflex); Future -     Urine cytology ancillary only([place])  Screen for STD (sexually transmitted disease) -     HIV antibody (with reflex); Future -     Urine cytology ancillary only([place])   No problem-specific Assessment & Plan notes found for this encounter.     Problem List Items Addressed This Visit    None    Visit Diagnoses    Encounter for preventative adult health care examination    -  Primary   Relevant Orders   CBC   Comprehensive metabolic panel   TSH   Lipid panel   HIV antibody (with reflex)   Urine cytology ancillary only([place])   Screen for STD (sexually transmitted disease)       Relevant Orders   HIV antibody (with reflex)   Urine cytology ancillary only([place])       Follow up: Return if symptoms worsen or fail to improve.  Alysia Pennaharlotte Keimora Swartout, NP

## 2019-05-07 LAB — URINE CYTOLOGY ANCILLARY ONLY
Chlamydia: NEGATIVE
Neisseria Gonorrhea: NEGATIVE
Trichomonas: NEGATIVE

## 2019-06-12 DIAGNOSIS — Z20828 Contact with and (suspected) exposure to other viral communicable diseases: Secondary | ICD-10-CM | POA: Diagnosis not present

## 2019-06-19 DIAGNOSIS — Z20828 Contact with and (suspected) exposure to other viral communicable diseases: Secondary | ICD-10-CM | POA: Diagnosis not present

## 2019-10-06 ENCOUNTER — Ambulatory Visit: Admission: EM | Admit: 2019-10-06 | Discharge: 2019-10-06 | Discharge status: Absent without leave | Payer: 59

## 2019-10-06 ENCOUNTER — Other Ambulatory Visit: Payer: Self-pay

## 2020-05-10 ENCOUNTER — Encounter: Payer: 59 | Admitting: Nurse Practitioner

## 2020-05-30 ENCOUNTER — Other Ambulatory Visit: Payer: Self-pay

## 2020-05-31 ENCOUNTER — Encounter: Payer: Self-pay | Admitting: Nurse Practitioner

## 2020-05-31 ENCOUNTER — Ambulatory Visit (INDEPENDENT_AMBULATORY_CARE_PROVIDER_SITE_OTHER): Payer: Managed Care, Other (non HMO) | Admitting: Nurse Practitioner

## 2020-05-31 ENCOUNTER — Other Ambulatory Visit (HOSPITAL_COMMUNITY)
Admission: RE | Admit: 2020-05-31 | Discharge: 2020-05-31 | Disposition: A | Discharge status: Home / Self Care | Payer: Managed Care, Other (non HMO) | Source: Ambulatory Visit | Attending: Nurse Practitioner | Admitting: Nurse Practitioner

## 2020-05-31 VITALS — BP 122/80 | HR 96 | Temp 97.8°F | Ht 73.0 in | Wt 198.8 lb

## 2020-05-31 DIAGNOSIS — Z0000 Encounter for general adult medical examination without abnormal findings: Secondary | ICD-10-CM

## 2020-05-31 DIAGNOSIS — Z720 Tobacco use: Secondary | ICD-10-CM | POA: Diagnosis not present

## 2020-05-31 DIAGNOSIS — Z8249 Family history of ischemic heart disease and other diseases of the circulatory system: Secondary | ICD-10-CM | POA: Insufficient documentation

## 2020-05-31 DIAGNOSIS — Z113 Encounter for screening for infections with a predominantly sexual mode of transmission: Secondary | ICD-10-CM

## 2020-05-31 DIAGNOSIS — Z136 Encounter for screening for cardiovascular disorders: Secondary | ICD-10-CM | POA: Diagnosis not present

## 2020-05-31 DIAGNOSIS — Z13220 Encounter for screening for lipoid disorders: Secondary | ICD-10-CM | POA: Diagnosis not present

## 2020-05-31 LAB — CBC
HCT: 43.7 % (ref 39.0–52.0)
Hemoglobin: 15 g/dL (ref 13.0–17.0)
MCHC: 34.4 g/dL (ref 30.0–36.0)
MCV: 91.7 fl (ref 78.0–100.0)
Platelets: 194 10*3/uL (ref 150.0–400.0)
RBC: 4.77 Mil/uL (ref 4.22–5.81)
RDW: 13.1 % (ref 11.5–15.5)
WBC: 5.7 10*3/uL (ref 4.0–10.5)

## 2020-05-31 LAB — COMPREHENSIVE METABOLIC PANEL
ALT: 28 U/L (ref 0–53)
AST: 19 U/L (ref 0–37)
Albumin: 4.4 g/dL (ref 3.5–5.2)
Alkaline Phosphatase: 41 U/L (ref 39–117)
BUN: 16 mg/dL (ref 6–23)
CO2: 27 mEq/L (ref 19–32)
Calcium: 9.2 mg/dL (ref 8.4–10.5)
Chloride: 103 mEq/L (ref 96–112)
Creatinine, Ser: 0.9 mg/dL (ref 0.40–1.50)
GFR: 103.64 mL/min (ref >60.00)
Glucose, Bld: 86 mg/dL (ref 70–99)
Potassium: 4 mEq/L (ref 3.5–5.1)
Sodium: 139 mEq/L (ref 135–145)
Total Bilirubin: 0.9 mg/dL (ref 0.2–1.2)
Total Protein: 6.8 g/dL (ref 6.0–8.3)

## 2020-05-31 LAB — LIPID PANEL
Cholesterol: 137 mg/dL (ref 0–200)
HDL: 57 mg/dL (ref >39.00)
LDL Cholesterol: 66 mg/dL (ref 0–99)
NonHDL: 79.82
Total CHOL/HDL Ratio: 2
Triglycerides: 68 mg/dL (ref 0.0–149.0)
VLDL: 13.6 mg/dL (ref 0.0–40.0)

## 2020-05-31 LAB — TSH: TSH: 1.5 u[IU]/mL (ref 0.35–4.50)

## 2020-05-31 MED ORDER — NICOTINE POLACRILEX 4 MG MT GUM: 4.0000 mg | CHEWING_GUM | OROMUCOSAL | 5 refills | Status: AC | PRN

## 2020-05-31 NOTE — Progress Notes (Signed)
Subjective:    Patient ID: Ricky Douglas, male    DOB: 11-22-1995, 24 y.o.   MRN: [id number]  Patient presents today for CPE and eval of tobacco use.  HPI   Family history of early CAD Mother at age 78 with MI Father at age 34, no MI  Smokeless tobacco use within past 30 days Use x2years, quit in past for 78months. Resume after returning for work environment (works at [employer]) Working on cessation by cutting back, current use if 1/3can per day.  Advised about need for dental exam and cleaning every 49months Provided nicotine gum for cravings. Also recommended use of hard candy or sugarless gum or nuts to minimize use. He verbalized understand and agreed with above plan.   Sexual History (orientation,birth control, marital status, STD):secxyally active, wants STD screen today, performs self testicular exam in shower  Depression/Suicide: Depression screen [facility] 2/9 05/31/2020 05/05/2019 10/29/2017  Decreased Interest 0 0 0  Down, Depressed, Hopeless 0 0 0  PHQ - 2 Score 0 0 0   Vision:not needed  Dental:will schedule  Immunizations: (TDAP, Hep C screen, Pneumovax, Influenza, zoster)  Health Maintenance  Topic Date Due    Hepatitis C: One time screening is recommended by Center for Disease Control  (CDC) for  adults born from [birth year] through 1965.   Never done   Flu Shot  01/19/2021*   Tetanus Vaccine  04/15/2024   COVID-19 Vaccine  Completed   HIV Screening  Completed  *Topic was postponed. The date shown is not the original due date.   Diet:rheart healthy.  Weight:  Wt Readings from Last 3 Encounters:  05/31/20 198 lb 12.8 oz (90.2 kg)  05/05/19 217 lb (98.4 kg)  10/29/17 194 lb (88 kg)   Fall Risk: Fall Risk  05/31/2020 05/05/2019 10/29/2017  Falls in the past year? 0 0 No  Number falls in past yr: 0 - -  Injury with Fall? 0 - -   Medications and allergies reviewed with patient and updated if appropriate.  Patient Active Problem List   Diagnosis Date Noted    Smokeless tobacco use within past 30 days 05/31/2020   Family history of early CAD 05/31/2020   Generalized anxiety disorder 05/17/2016   Dysthymia 05/17/2016   ADHD (attention deficit hyperactivity disorder) 05/09/2016   Acne 05/09/2016   Insomnia 01/07/2016    No current outpatient medications on file prior to visit.   No current facility-administered medications on file prior to visit.    Past Medical History:  Diagnosis Date   Allergy    Anxiety    Chicken pox     Past Surgical History:  Procedure Laterality Date   TONSILLECTOMY AND ADENOIDECTOMY  2000    Social History   Socioeconomic History   Marital status: Single    Spouse name: Not on file   Number of children: Not on file   Years of education: Not on file   Highest education level: Not on file  Occupational History    Comment: [employer] in [place]  Tobacco Use   Smoking status: Former Smoker    Types: Cigarettes   Smokeless tobacco: Current User    Types: Chew   Tobacco comment: nicotidine gum recommended  Vaping Use   Vaping Use: Never used  Substance and Sexual Activity   Alcohol use: Yes    Alcohol/week: 4.0 standard drinks    Types: 3 Glasses of wine, 1 Cans of beer per week   Drug use: Not Currently  Frequency: 3.0 times per week    Types: Marijuana   Sexual activity: Yes    Birth control/protection: None  Other Topics Concern   Not on file  Social History Narrative   Not on file   Social Determinants of Health   Financial Resource Strain:    Difficulty of Paying Living Expenses:   Food Insecurity:    Worried About Programme researcher, broadcasting/film/video in the Last Year:    Barista in the Last Year:   Transportation Needs:    Freight forwarder (Medical):    Lack of Transportation (Non-Medical):   Physical Activity:    Days of Exercise per Week:    Minutes of Exercise per Session:   Stress:    Feeling of Stress :   Social Connections:     Frequency of Communication with Friends and Family:    Frequency of Social Gatherings with Friends and Family:    Attends Religious Services:    Active Member of Clubs or Organizations:    Attends Engineer, structural:    Marital Status:     Family History  Problem Relation Age of Onset   Arthritis Mother    Miscarriages / Stillbirths Mother    Heart disease Mother 33       cad/mi   Anxiety disorder Mother    Arthritis Father    Hearing loss Father    Heart disease Father 34       CAD   Arthritis Maternal Grandmother    Cancer Maternal Grandmother    Depression Maternal Grandmother    Hypertension Maternal Grandmother    Depression Maternal Grandfather    Hearing loss Maternal Grandfather    Arthritis Paternal Grandmother    Depression Paternal Grandfather    Hearing loss Paternal Grandfather         Review of Systems  Constitutional: Negative for fever, malaise/fatigue and weight loss.  HENT: Negative for congestion and sore throat.   Eyes:       Negative for visual changes  Respiratory: Negative for cough and shortness of breath.   Cardiovascular: Negative for chest pain, palpitations and leg swelling.  Gastrointestinal: Negative for blood in stool, constipation, diarrhea and heartburn.  Genitourinary: Negative for dysuria, frequency and urgency.  Musculoskeletal: Negative for falls, joint pain and myalgias.  Skin: Negative for rash.  Neurological: Negative for dizziness, sensory change and headaches.  Endo/Heme/Allergies: Does not bruise/bleed easily.  Psychiatric/Behavioral: Negative for depression, substance abuse and suicidal ideas. The patient is not nervous/anxious and does not have insomnia.     Objective:   Vitals:   05/31/20 1131  BP: 122/80  Pulse: 96  Temp: 97.8 F (36.6 C)  SpO2: 99%    Body mass index is 26.23 kg/m.   Physical Examination:  Physical Exam Vitals reviewed.  Constitutional:      General: He  is not in acute distress.    Appearance: He is well-developed.  HENT:     Right Ear: Tympanic membrane, ear canal and external ear normal.     Left Ear: Tympanic membrane, ear canal and external ear normal.  Eyes:     Extraocular Movements: Extraocular movements intact.     Conjunctiva/sclera: Conjunctivae normal.  Cardiovascular:     Rate and Rhythm: Normal rate and regular rhythm.     Heart sounds: Normal heart sounds.  Pulmonary:     Effort: Pulmonary effort is normal. No respiratory distress.     Breath sounds: Normal breath sounds.  Chest:     Chest wall: No tenderness.  Abdominal:     General: Bowel sounds are normal.     Palpations: Abdomen is soft.  Genitourinary:    Comments: declined Musculoskeletal:        General: Normal range of motion.     Cervical back: Normal range of motion and neck supple.  Lymphadenopathy:     Cervical: No cervical adenopathy.  Skin:    General: Skin is warm and dry.  Neurological:     Mental Status: He is alert and oriented to person, place, and time.     Deep Tendon Reflexes: Reflexes are normal and symmetric.  Psychiatric:        Mood and Affect: Mood normal.        Behavior: Behavior normal.        Thought Content: Thought content normal.    ASSESSMENT and PLAN: This visit occurred during the SARS-CoV-2 public health emergency.  Safety protocols were in place, including screening questions prior to the visit, additional usage of staff PPE, and extensive cleaning of exam room while observing appropriate contact time as indicated for disinfecting solutions.   Zackarie was seen today for annual exam.  Diagnoses and all orders for this visit:  Preventative health care -     CBC -     Comprehensive metabolic panel -     Lipid panel -     TSH  Encounter for lipid screening for cardiovascular disease -     Lipid panel  Screening examination for STD (sexually transmitted disease) -     HIV antibody (with reflex) -     RPR -      Urine cytology ancillary only([place]) -     Hepatitis C Antibody  Smokeless tobacco use within past 30 days -     nicotine polacrilex (RA NICOTINE GUM) 4 MG gum; Take 1 each (4 mg total) by mouth as needed (smokeless tobacco use). X6weeks        Problem List Items Addressed This Visit      Other   Smokeless tobacco use within past 30 days    Use x2years, quit in past for 52months. Resume after returning for work environment (works at [employer]) Working on cessation by cutting back, current use if 1/3can per day.  Advised about need for dental exam and cleaning every 58months Provided nicotine gum for cravings. Also recommended use of hard candy or sugarless gum or nuts to minimize use. He verbalized understand and agreed with above plan.      Relevant Medications   nicotine polacrilex (RA NICOTINE GUM) 4 MG gum    Other Visit Diagnoses    Preventative health care    -  Primary   Relevant Orders   CBC   Comprehensive metabolic panel   Lipid panel   TSH   Encounter for lipid screening for cardiovascular disease       Relevant Orders   Lipid panel   Screening examination for STD (sexually transmitted disease)       Relevant Orders   HIV antibody (with reflex)   RPR   Urine cytology ancillary only([place])   Hepatitis C Antibody      Follow up: Return in about 1 year (around 05/31/2021) for CPE.  Alysia Penna, NP

## 2020-05-31 NOTE — Assessment & Plan Note (Signed)
Mother at age 24 with MI Father at age 14, no MI

## 2020-05-31 NOTE — Patient Instructions (Addendum)
Go to lab for urine collection and blood draw.  Smokeless Tobacco Information, Adult  Tobacco is a leafy plant that contains a chemical (nicotine). Nicotine affects your brain and causes you to become addicted to it. Smokeless tobacco is tobacco that you put in your mouth instead of smoking it. It may also be called chewing tobacco or snuff. Smokeless tobacco is made from the leaves of tobacco plants and comes in many forms, such as:  Loose, dry leaves.  Plugs or twists.  Moist pouches.  Dissolving lozenges or strips. Chewing, sucking, or holding the tobacco in your mouth causes your mouth to make more saliva. The saliva mixes with the tobacco, which you swallow or spit out. Using tobacco is harmful to your health. How can smokeless tobacco affect me? All forms of tobacco contain many chemicals that can harm every organ in the body. Using smokeless tobacco increases your risk for:  Cancer. Tobacco use is one of the leading causes of cancer. Smokeless tobacco is linked to cancer of the mouth, esophagus, and pancreas.  Other long-term health problems, including high blood pressure, heart disease, and stroke.  Addiction.  Pregnancy complications, if this applies. Pregnant women who use smokeless tobacco are more likely to have a miscarriage or deliver a baby too early.  Mouth and dental problems, such as: ? Bad breath. ? Teeth that look yellow or brown. ? Mouth sores. ? Cracking and bleeding lips. ? Gum recession, gum disease, or tooth decay. ? Lesions on the soft tissues of your mouth (leukoplakia). The benefits of not using smokeless tobacco include:  A healthy mind and body.  Saving money. You avoid the cost of buying tobacco and the cost of treating illnesses that are caused by tobacco. What actions can I take to quit using tobacco? Ask your health care provider for help to quit using smokeless tobacco. This may involve treatment. These tips may also help you quit:  Pick a  date to quit. Set a date within the next two weeks. This gives you time to prepare.  Write down the reasons why you are quitting. Keep this list in places where you will see it often, such as on your bathroom mirror or in your car or wallet.  Identify the people, places, things, and activities that make you want to use smokeless tobacco (triggers). Avoid them.  Get rid of any tobacco you have and remove any tobacco smells. To do this: ? Throw away all containers of tobacco at home, at work, and in your car. ? Throw away any other items that you use regularly when you chew tobacco. ? Clean your car and make sure to remove all tobacco-related items. ? Clean your home, including curtains and carpets.  Tell your family and friends that you are quitting. Having support can make quitting easier.  Chew sugarless gum or sunflower seeds when you want to use smokeless tobacco.  Stay positive. Be prepared for cravings. It is common to slip up when you first quit, so take it one day at a time.  Stay busy and take care of your body. Get plenty of exercise. Drink enough water to keep your urine pale yellow.  Keep track of how many days have passed since you quit. Remembering how long and hard you have worked to quit can help you avoid using smokeless tobacco again. Where to find support Ask your health care provider if there is a local support group for quitting smokeless tobacco. Where to find more information You  can learn more about the risks of using smokeless tobacco and the benefits of quitting from these sources:  American Cancer Society: www.cancer.org  National Cancer Institute: www.cancer.gov  Centers for Disease Control and Prevention: [employer]Ex[employer].com.br Contact a health care provider if you have:  Trouble quitting smokeless tobacco use on your own.  White or other discolored patches in your mouth.  Difficulty swallowing.  A change in your voice.  Unexplained weight loss.  Stomach  pain, nausea, or vomiting. Summary  Smokeless tobacco contains many different chemicals that are known to cause cancer.  Nicotine is an addictive chemical in smokeless tobacco that affects your brain.  The benefits of not using smokeless tobacco include having a healthy mind and body and saving money.  Tell your family and friends that you are quitting. Having support can make quitting easier.  Ask your health care provider for help quitting smokeless tobacco. This may involve treatment. This information is not intended to replace advice given to you by your health care provider. Make sure you discuss any questions you have with your health care provider. Document Revised: 07/03/2019 Document Reviewed: 12/15/2018 Elsevier Patient Education  2020 [employer].

## 2020-05-31 NOTE — Assessment & Plan Note (Signed)
Use x2years, quit in past for 93months. Resume after returning for work environment (works at [employer]) Working on cessation by cutting back, current use if 1/3can per day.  Advised about need for dental exam and cleaning every 74months Provided nicotine gum for cravings. Also recommended use of hard candy or sugarless gum or nuts to minimize use. He verbalized understand and agreed with above plan.

## 2020-06-01 LAB — HEPATITIS C ANTIBODY
Hepatitis C Ab: NONREACTIVE
SIGNAL TO CUT-OFF: 0 (ref <1.00)

## 2020-06-01 LAB — URINE CYTOLOGY ANCILLARY ONLY
Chlamydia: NEGATIVE
Comment: NEGATIVE
Comment: NEGATIVE
Comment: NORMAL
Neisseria Gonorrhea: NEGATIVE
Trichomonas: NEGATIVE

## 2020-06-01 LAB — RPR: RPR Ser Ql: NONREACTIVE

## 2020-06-01 LAB — HIV ANTIBODY (ROUTINE TESTING W REFLEX): HIV 1&2 Ab, 4th Generation: NONREACTIVE

## 2021-05-01 ENCOUNTER — Ambulatory Visit (INDEPENDENT_AMBULATORY_CARE_PROVIDER_SITE_OTHER): Payer: Managed Care, Other (non HMO) | Admitting: Family Medicine

## 2021-05-01 ENCOUNTER — Encounter: Payer: Self-pay | Admitting: Family Medicine

## 2021-05-01 ENCOUNTER — Other Ambulatory Visit: Payer: Self-pay

## 2021-05-01 ENCOUNTER — Other Ambulatory Visit (HOSPITAL_COMMUNITY)
Admission: RE | Admit: 2021-05-01 | Discharge: 2021-05-01 | Disposition: A | Discharge status: Home / Self Care | Payer: Managed Care, Other (non HMO) | Source: Ambulatory Visit | Attending: Family Medicine | Admitting: Family Medicine

## 2021-05-01 ENCOUNTER — Other Ambulatory Visit (HOSPITAL_COMMUNITY): Payer: Self-pay

## 2021-05-01 VITALS — BP 108/60 | HR 73 | Temp 98.1°F | Ht 72.0 in | Wt 217.9 lb

## 2021-05-01 DIAGNOSIS — M12811 Other specific arthropathies, not elsewhere classified, right shoulder: Secondary | ICD-10-CM | POA: Diagnosis not present

## 2021-05-01 DIAGNOSIS — Z0000 Encounter for general adult medical examination without abnormal findings: Secondary | ICD-10-CM | POA: Insufficient documentation

## 2021-05-01 DIAGNOSIS — Z113 Encounter for screening for infections with a predominantly sexual mode of transmission: Secondary | ICD-10-CM | POA: Diagnosis not present

## 2021-05-01 DIAGNOSIS — Z720 Tobacco use: Secondary | ICD-10-CM | POA: Diagnosis not present

## 2021-05-01 LAB — COMPREHENSIVE METABOLIC PANEL
ALT: 22 U/L (ref 0–53)
AST: 17 U/L (ref 0–37)
Albumin: 4.6 g/dL (ref 3.5–5.2)
Alkaline Phosphatase: 50 U/L (ref 39–117)
BUN: 17 mg/dL (ref 6–23)
CO2: 26 mEq/L (ref 19–32)
Calcium: 9.4 mg/dL (ref 8.4–10.5)
Chloride: 103 mEq/L (ref 96–112)
Creatinine, Ser: 0.88 mg/dL (ref 0.40–1.50)
GFR: 119.98 mL/min (ref >60.00)
Glucose, Bld: 87 mg/dL (ref 70–99)
Potassium: 3.9 mEq/L (ref 3.5–5.1)
Sodium: 137 mEq/L (ref 135–145)
Total Bilirubin: 1.4 mg/dL — ABNORMAL HIGH (ref 0.2–1.2)
Total Protein: 7.1 g/dL (ref 6.0–8.3)

## 2021-05-01 LAB — LIPID PANEL
Cholesterol: 140 mg/dL (ref 0–200)
HDL: 54.2 mg/dL (ref >39.00)
LDL Cholesterol: 67 mg/dL (ref 0–99)
NonHDL: 85.41
Total CHOL/HDL Ratio: 3
Triglycerides: 90 mg/dL (ref 0.0–149.0)
VLDL: 18 mg/dL (ref 0.0–40.0)

## 2021-05-01 LAB — URINALYSIS, ROUTINE W REFLEX MICROSCOPIC
Bilirubin Urine: NEGATIVE
Hgb urine dipstick: NEGATIVE
Ketones, ur: NEGATIVE
Leukocytes,Ua: NEGATIVE
Nitrite: NEGATIVE
RBC / HPF: NONE SEEN (ref 0–?)
Specific Gravity, Urine: 1.015 (ref 1.000–1.030)
Total Protein, Urine: NEGATIVE
Urine Glucose: NEGATIVE
Urobilinogen, UA: 0.2 (ref 0.0–1.0)
WBC, UA: NONE SEEN (ref 0–?)
pH: 7 (ref 5.0–8.0)

## 2021-05-01 LAB — CBC
HCT: 47.1 % (ref 39.0–52.0)
Hemoglobin: 16.2 g/dL (ref 13.0–17.0)
MCHC: 34.4 g/dL (ref 30.0–36.0)
MCV: 89.7 fl (ref 78.0–100.0)
Platelets: 202 10*3/uL (ref 150.0–400.0)
RBC: 5.25 Mil/uL (ref 4.22–5.81)
RDW: 13 % (ref 11.5–15.5)
WBC: 5.5 10*3/uL (ref 4.0–10.5)

## 2021-05-01 MED ORDER — MELOXICAM 7.5 MG PO TABS
7.5000 mg | ORAL_TABLET | Freq: Every day | ORAL | 2 refills | Status: AC
  Filled 2021-05-01 – 2021-05-25 (×2): qty 30, 30d supply, fill #0

## 2021-05-01 NOTE — Progress Notes (Signed)
Established Patient Office Visit  Subjective:  Patient ID: Ricky Douglas, male    DOB: Dec 07, 1995  Age: 25 y.o. MRN: [id number]  CC:  Chief Complaint  Patient presents with   Annual Exam    CPE. Pt c/o shoulder discomfort.    HPI Ricky Douglas presents for a physical exam and follow-up of ongoing intermittent right shoulder pain.  Patient is right-handed.  He has played sports through high school and first year of college.  He is played baseball and has played first and third base.  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  Pain is present in the anterior and posterior shoulder.  No specific injury.  He believes he had an advanced imaging study done at age 21 that showed inflammation.  Past Medical History:  Diagnosis Date   Allergy    Anxiety    Chicken pox     Past Surgical History:  Procedure Laterality Date   TONSILLECTOMY AND ADENOIDECTOMY  2000    Family History  Problem Relation Age of Onset   Arthritis Mother    Miscarriages / Stillbirths Mother    Heart disease Mother 65       cad/mi   Anxiety disorder Mother    Arthritis Father    Hearing loss Father    Heart disease Father 38       CAD   Arthritis Maternal Grandmother    Cancer Maternal Grandmother    Depression Maternal Grandmother    Hypertension Maternal Grandmother    Depression Maternal Grandfather    Hearing loss Maternal Grandfather    Arthritis Paternal Grandmother    Depression Paternal Grandfather    Hearing loss Paternal Grandfather    Cancer Paternal Grandfather     Social History   Socioeconomic History   Marital status: Single    Spouse name: Not on file   Number of children: Not on file   Years of education: Not on file   Highest education level: Not on file  Occupational History    Comment: [employer] in [place]  Tobacco Use   Smoking status: Former    Pack years: 0.00    Types: Cigarettes   Smokeless tobacco: Current    Types: Chew    Tobacco comments:    nicotidine gum recommended  Vaping Use   Vaping Use: Never used  Substance and Sexual Activity   Alcohol use: Yes    Alcohol/week: 4.0 standard drinks    Types: 3 Glasses of wine, 1 Cans of beer per week   Drug use: Not Currently    Frequency: 3.0 times per week    Types: Marijuana   Sexual activity: Yes    Birth control/protection: None  Other Topics Concern   Not on file  Social History Narrative   Not on file   Social Determinants of Health   Financial Resource Strain: Not on file  Food Insecurity: Not on file  Transportation Needs: Not on file  Physical Activity: Not on file  Stress: Not on file  Social Connections: Not on file  Intimate Partner Violence: Not on file    Outpatient Medications Prior to Visit  Medication Sig Dispense Refill   nicotine polacrilex (RA NICOTINE GUM) 4 MG gum Take 1 each (4 mg total) by mouth as needed (smokeless tobacco use). Epimenio Foot (Patient not taking: Reported on 05/01/2021) 100 tablet 5   No facility-administered medications prior to visit.    Allergies  Allergen Reactions  Codeine Itching, Nausea And Vomiting and Hives    ROS Review of Systems  Constitutional:  Negative for diaphoresis, fatigue, fever and unexpected weight change.  Eyes:  Negative for photophobia and visual disturbance.  Respiratory: Negative.    Cardiovascular: Negative.   Gastrointestinal: Negative.   Endocrine: Negative for polyphagia and polyuria.  Genitourinary: Negative.   Musculoskeletal:  Positive for arthralgias.  Skin: Negative.   Neurological:  Negative for weakness and numbness.  Psychiatric/Behavioral: Negative.       Objective:    Physical Exam Vitals and nursing note reviewed.  Constitutional:      General: He is not in acute distress.    Appearance: Normal appearance. He is obese. He is not ill-appearing, toxic-appearing or diaphoretic.  HENT:     Head: Normocephalic and atraumatic.     Right Ear: Tympanic  membrane, ear canal and external ear normal.     Left Ear: Tympanic membrane, ear canal and external ear normal.     Mouth/Throat:     Mouth: Mucous membranes are moist.     Pharynx: Oropharynx is clear. No oropharyngeal exudate or posterior oropharyngeal erythema.  Eyes:     General: No scleral icterus.       Right eye: No discharge.        Left eye: No discharge.     Extraocular Movements: Extraocular movements intact.     Conjunctiva/sclera: Conjunctivae normal.     Pupils: Pupils are equal, round, and reactive to light.  Cardiovascular:     Rate and Rhythm: Normal rate and regular rhythm.  Pulmonary:     Effort: Pulmonary effort is normal.     Breath sounds: Normal breath sounds.  Abdominal:     General: Bowel sounds are normal. There is no distension.     Palpations: There is no mass.     Tenderness: There is no abdominal tenderness. There is no guarding or rebound.     Hernia: No hernia is present. There is no hernia in the left inguinal area or right inguinal area.  Genitourinary:    Penis: No paraphimosis, hypospadias, erythema, tenderness, discharge, swelling or lesions.      Testes:        Right: Mass, tenderness or swelling not present. Right testis is descended.        Left: Mass, tenderness or swelling not present. Left testis is descended.     Epididymis:     Right: Not inflamed or enlarged. No mass.     Left: Not inflamed or enlarged. No mass.    Musculoskeletal:     Right shoulder: Tenderness present. No bony tenderness. Normal range of motion.     Left shoulder: Normal.       Arms:     Cervical back: No rigidity or tenderness.  Lymphadenopathy:     Cervical: No cervical adenopathy.     Lower Body: No right inguinal adenopathy. No left inguinal adenopathy.  Skin:    General: Skin is warm and dry.  Neurological:     Mental Status: He is alert and oriented to person, place, and time.  Psychiatric:        Mood and Affect: Mood normal.        Behavior:  Behavior normal.    BP 108/60   Pulse 73   Temp 98.1 F (36.7 C) (Oral)   Ht 6' (1.829 m)   Wt 217 lb 14.4 oz (98.8 kg)   SpO2 97%   BMI 29.55 kg/m  Wt Readings  from Last 3 Encounters:  05/01/21 217 lb 14.4 oz (98.8 kg)  05/31/20 198 lb 12.8 oz (90.2 kg)  05/05/19 217 lb (98.4 kg)     Health Maintenance Due  Topic Date Due   HPV VACCINES (3 - Male 3-dose series) 08/15/2014   COVID-19 Vaccine (3 - Booster for Pfizer series) 07/03/2020       Topic Date Due   HPV VACCINES (3 - Male 3-dose series) 08/15/2014    Lab Results  Component Value Date   TSH 1.50 05/31/2020   Lab Results  Component Value Date   WBC 5.7 05/31/2020   HGB 15.0 05/31/2020   HCT 43.7 05/31/2020   MCV 91.7 05/31/2020   PLT 194.0 05/31/2020   Lab Results  Component Value Date   NA 139 05/31/2020   K 4.0 05/31/2020   CO2 27 05/31/2020   GLUCOSE 86 05/31/2020   BUN 16 05/31/2020   CREATININE 0.90 05/31/2020   BILITOT 0.9 05/31/2020   ALKPHOS 41 05/31/2020   AST 19 05/31/2020   ALT 28 05/31/2020   PROT 6.8 05/31/2020   ALBUMIN 4.4 05/31/2020   CALCIUM 9.2 05/31/2020   GFR 103.64 05/31/2020   Lab Results  Component Value Date   CHOL 137 05/31/2020   Lab Results  Component Value Date   HDL 57.00 05/31/2020   Lab Results  Component Value Date   LDLCALC 66 05/31/2020   Lab Results  Component Value Date   TRIG 68.0 05/31/2020   Lab Results  Component Value Date   CHOLHDL 2 05/31/2020   No results found for: HGBA1C    Assessment & Plan:   Problem List Items Addressed This Visit       Musculoskeletal and Integument   Rotator cuff arthropathy of right shoulder   Relevant Medications   meloxicam (MOBIC) 7.5 MG tablet   Other Relevant Orders   Ambulatory referral to Sports Medicine     Other   Smokeless tobacco use within past 30 days   Preventative health care - Primary   Relevant Orders   CBC   Comprehensive metabolic panel   Lipid panel    Meds ordered this  encounter  Medications   meloxicam (MOBIC) 7.5 MG tablet    Sig: Take 1 tablet (7.5 mg total) by mouth daily. As needed with food.    Dispense:  30 tablet    Refill:  2     Follow-up: Return in about 1 year (around 05/01/2022), or if symptoms worsen or fail to improve.  Given information on health maintenance and disease prevention.  We discussed using protection for sexual encounter.  Given information about smokeless tobacco use.  Sports medicine referral for shoulder pain.  Needs rotator cuff visit visualization.  We discussed the usefulness of steroid injections.  Mliss Sax, MD

## 2021-05-02 LAB — RPR: RPR Ser Ql: NONREACTIVE

## 2021-05-02 LAB — URINE CYTOLOGY ANCILLARY ONLY
Chlamydia: NEGATIVE
Comment: NEGATIVE
Comment: NORMAL
Neisseria Gonorrhea: NEGATIVE

## 2021-05-02 LAB — HIV ANTIBODY (ROUTINE TESTING W REFLEX): HIV 1&2 Ab, 4th Generation: NONREACTIVE

## 2021-05-16 ENCOUNTER — Other Ambulatory Visit (HOSPITAL_COMMUNITY): Payer: Self-pay

## 2021-05-25 ENCOUNTER — Other Ambulatory Visit (HOSPITAL_COMMUNITY): Payer: Self-pay

## 2021-06-02 ENCOUNTER — Encounter: Payer: Managed Care, Other (non HMO) | Admitting: Nurse Practitioner

## 2023-07-24 ENCOUNTER — Other Ambulatory Visit (HOSPITAL_COMMUNITY): Payer: Self-pay

## 2023-08-21 ENCOUNTER — Telehealth: Payer: Self-pay | Admitting: Nurse Practitioner

## 2023-08-21 NOTE — Telephone Encounter (Signed)
[facility] to schedule an appt
